# Patient Record
Sex: Female | Born: 1990 | Race: White | Hispanic: No | Marital: Single | Smoking: Never smoker
Health system: Southern US, Community
[De-identification: ages and names within clinical notes are randomized; demographics above are authoritative.]

## PROBLEM LIST (undated history)

## (undated) ENCOUNTER — Inpatient Hospital Stay: Payer: Self-pay

## (undated) DIAGNOSIS — O24419 Gestational diabetes mellitus in pregnancy, unspecified control: Secondary | ICD-10-CM

## (undated) DIAGNOSIS — I498 Other specified cardiac arrhythmias: Secondary | ICD-10-CM

## (undated) HISTORY — PX: TUMOR REMOVAL: SHX12

## (undated) HISTORY — DX: Gestational diabetes mellitus in pregnancy, unspecified control: O24.419

## (undated) HISTORY — PX: TONSILLECTOMY: SUR1361

---

## 2016-05-27 ENCOUNTER — Encounter: Payer: Self-pay | Admitting: Urgent Care

## 2016-05-27 DIAGNOSIS — O2342 Unspecified infection of urinary tract in pregnancy, second trimester: Secondary | ICD-10-CM | POA: Insufficient documentation

## 2016-05-27 DIAGNOSIS — R102 Pelvic and perineal pain: Secondary | ICD-10-CM | POA: Insufficient documentation

## 2016-05-27 DIAGNOSIS — Z3A15 15 weeks gestation of pregnancy: Secondary | ICD-10-CM | POA: Insufficient documentation

## 2016-05-27 DIAGNOSIS — G43909 Migraine, unspecified, not intractable, without status migrainosus: Secondary | ICD-10-CM | POA: Insufficient documentation

## 2016-05-27 DIAGNOSIS — O99352 Diseases of the nervous system complicating pregnancy, second trimester: Secondary | ICD-10-CM | POA: Diagnosis present

## 2016-05-27 LAB — BASIC METABOLIC PANEL
Anion gap: 8 (ref 5–15)
BUN: 8 mg/dL (ref 6–20)
CO2: 21 mmol/L — ABNORMAL LOW (ref 22–32)
Calcium: 9.2 mg/dL (ref 8.9–10.3)
Chloride: 107 mmol/L (ref 101–111)
Creatinine, Ser: 0.59 mg/dL (ref 0.44–1.00)
GFR calc Af Amer: >60 mL/min (ref >60)
GFR calc non Af Amer: >60 mL/min (ref >60)
Glucose, Bld: 214 mg/dL — ABNORMAL HIGH (ref 65–99)
Potassium: 3.2 mmol/L — ABNORMAL LOW (ref 3.5–5.1)
Sodium: 136 mmol/L (ref 135–145)

## 2016-05-27 LAB — CBC
HCT: 32.8 % — ABNORMAL LOW (ref 35.0–47.0)
Hemoglobin: 11.9 g/dL — ABNORMAL LOW (ref 12.0–16.0)
MCH: 29.9 pg (ref 26.0–34.0)
MCHC: 36.2 g/dL — ABNORMAL HIGH (ref 32.0–36.0)
MCV: 82.6 fL (ref 80.0–100.0)
Platelets: 237 10*3/uL (ref 150–440)
RBC: 3.97 MIL/uL (ref 3.80–5.20)
RDW: 13.2 % (ref 11.5–14.5)
WBC: 12.1 10*3/uL — ABNORMAL HIGH (ref 3.6–11.0)

## 2016-05-27 LAB — URINALYSIS COMPLETE WITH MICROSCOPIC (ARMC ONLY)
Bilirubin Urine: NEGATIVE
Glucose, UA: >500 mg/dL — AB
Hgb urine dipstick: NEGATIVE
Nitrite: NEGATIVE
Protein, ur: 30 mg/dL — AB
Specific Gravity, Urine: 1.026 (ref 1.005–1.030)
pH: 5 (ref 5.0–8.0)

## 2016-05-27 LAB — HCG, QUANTITATIVE, PREGNANCY: hCG, Beta Chain, Quant, S: 32650 m[IU]/mL — ABNORMAL HIGH (ref <5)

## 2016-05-27 NOTE — ED Notes (Signed)
Patient presents with c/o LOWER abdominal pain since yesterday. (+) RIGHT flank pain as well. Denies urinary symptoms. Patient also with a migraine h/a; similar to previous. Denies N/V. Of note, patient is an approximately 13 week G2P1A1s followed by [facility]; has first official appointment on Tuesday.

## 2016-05-28 ENCOUNTER — Emergency Department
Admission: EM | Admit: 2016-05-28 | Discharge: 2016-05-28 | Disposition: A | Discharge status: Home / Self Care | Payer: BC Managed Care – PPO | Attending: Emergency Medicine | Admitting: Emergency Medicine

## 2016-05-28 ENCOUNTER — Emergency Department: Payer: BC Managed Care – PPO

## 2016-05-28 DIAGNOSIS — O2342 Unspecified infection of urinary tract in pregnancy, second trimester: Secondary | ICD-10-CM | POA: Diagnosis not present

## 2016-05-28 DIAGNOSIS — O26899 Other specified pregnancy related conditions, unspecified trimester: Secondary | ICD-10-CM

## 2016-05-28 DIAGNOSIS — R109 Unspecified abdominal pain: Secondary | ICD-10-CM

## 2016-05-28 DIAGNOSIS — R1030 Lower abdominal pain, unspecified: Secondary | ICD-10-CM

## 2016-05-28 DIAGNOSIS — N390 Urinary tract infection, site not specified: Secondary | ICD-10-CM

## 2016-05-28 HISTORY — DX: Other specified cardiac arrhythmias: I49.8

## 2016-05-28 MED ORDER — ACETAMINOPHEN 500 MG PO TABS
ORAL_TABLET | ORAL | Status: AC
  Administered 2016-05-28: 03:00:00
  Filled 2016-05-28: qty 2

## 2016-05-28 NOTE — ED Provider Notes (Addendum)
-----------------------------------------   8:09 AM on 05/28/2016 -----------------------------------------  Patient signed out to me is medically cleared pending ultrasound. Ultrasound is underway. There was a delay because of computer downtime apparently. CPK per charting for further details. Patient does have white cells in her urine. Will be treated.   Jeanmarie Plant, MD 05/28/16 0810 .----------------------------------------- 9:32 AM on 05/28/2016 -----------------------------------------  Ultrasound reassuring, and sound asleep with no abdominal discomfort at this time abdomen is benign. Return precautions and follow-up given and understood.   Jeanmarie Plant, MD 05/28/16 [phone]

## 2016-05-28 NOTE — ED Notes (Signed)
Epic downtime occurred, for patient care betwe 05/28/16 from 12:25am to 4:53am see paper chart

## 2016-05-28 NOTE — Discharge Instructions (Signed)
Please follow-up your OB/GYN this week as scheduled. Return to the emergency department for any worsening abdominal pain, vaginal bleeding or discharge.

## 2016-05-30 LAB — OB RESULTS CONSOLE HEPATITIS B SURFACE ANTIGEN: Hepatitis B Surface Ag: NEGATIVE

## 2016-05-30 LAB — OB RESULTS CONSOLE HIV ANTIBODY (ROUTINE TESTING): HIV: NONREACTIVE

## 2016-05-30 LAB — OB RESULTS CONSOLE RPR: RPR: NONREACTIVE

## 2016-05-30 LAB — OB RESULTS CONSOLE RUBELLA ANTIBODY, IGM: RUBELLA: NON-IMMUNE/NOT IMMUNE

## 2016-05-30 LAB — OB RESULTS CONSOLE VARICELLA ZOSTER ANTIBODY, IGG: Varicella: IMMUNE

## 2016-08-30 ENCOUNTER — Telehealth: Payer: Self-pay | Admitting: *Deleted

## 2016-08-30 ENCOUNTER — Encounter: Payer: BC Managed Care – PPO | Attending: Obstetrics and Gynecology | Admitting: *Deleted

## 2016-08-30 ENCOUNTER — Encounter: Payer: Self-pay | Admitting: *Deleted

## 2016-08-30 VITALS — BP 110/62 | Ht 62.0 in | Wt 222.4 lb

## 2016-08-30 DIAGNOSIS — O24419 Gestational diabetes mellitus in pregnancy, unspecified control: Secondary | ICD-10-CM | POA: Insufficient documentation

## 2016-08-30 DIAGNOSIS — O24410 Gestational diabetes mellitus in pregnancy, diet controlled: Secondary | ICD-10-CM

## 2016-08-30 DIAGNOSIS — Z3A00 Weeks of gestation of pregnancy not specified: Secondary | ICD-10-CM | POA: Diagnosis not present

## 2016-08-30 NOTE — Progress Notes (Signed)
Diabetes Self-Management Education  Visit Type: First/Initial  Appt. Start Time: 1045 Appt. End Time: 1220  08/30/2016  Ms. Eileen FantasiaRachel Colligan, identified by name and date of birth, is a 25 y.o. female with a diagnosis of Diabetes: Gestational Diabetes.   ASSESSMENT  Blood pressure 110/62, height 5\' 2"  (1.575 m), weight 222 lb 6.4 oz (100.9 kg), last menstrual period 02/13/2016. Body mass index is 40.68 kg/m.      Diabetes Self-Management Education - 08/30/16 1347      Visit Information   Visit Type First/Initial     Initial Visit   Diabetes Type Gestational Diabetes   Are you currently following a meal plan? Yes   What type of meal plan do you follow? "trying not to eat a lot of sugars"   Are you taking your medications as prescribed? Yes   Date Diagnosed August 01, 2016     Health Coping   How would you rate your overall health? Excellent     Psychosocial Assessment   Patient Belief/Attitude about Diabetes Other (comment)  feels "normal"   Self-care barriers None   Self-management support Doctor's office;Friends;Family   Other persons present Spouse/SO   Patient Concerns Nutrition/Meal planning;Glycemic Control;Monitoring;Healthy Lifestyle   Special Needs None   Preferred Learning Style Auditory;Visual   Learning Readiness Change in progress   How often do you need to have someone help you when you read instructions, pamphlets, or other written materials from your doctor or pharmacy? 1 - Never   What is the last grade level you completed in school? high school diploma     Pre-Education Assessment   Patient understands the diabetes disease and treatment process. Needs Instruction   Patient understands incorporating nutritional management into lifestyle. Needs Instruction   Patient undertands incorporating physical activity into lifestyle. Needs Instruction   Patient understands using medications safely. Needs Instruction   Patient understands monitoring blood glucose,  interpreting and using results Needs Instruction   Patient understands prevention, detection, and treatment of acute complications. Needs Instruction   Patient understands prevention, detection, and treatment of chronic complications. Needs Instruction   Patient understands how to develop strategies to address psychosocial issues. Needs Instruction   Patient understands how to develop strategies to promote health/change behavior. Needs Instruction     Complications   How often do you check your blood sugar? 1-2 times/day  Pt received meter and just started testing.    Fasting Blood glucose range (mg/dL) 69-629;[id number];130-179  FBG's 115 -131 mg/dL   Postprandial Blood glucose range (mg/dL) 244-010;>272130-179;>200  2 post meal readings of 147 and 208 mg/dL.    Have you had a dilated eye exam in the past 12 months? No   Have you had a dental exam in the past 12 months? Yes   Are you checking your feet? No     Dietary Intake   Breakfast cereal, toast, eggs   Lunch sandwich or left overs from supper   Dinner meat (beef, chicken, pork, [place]key) with carrots, zucchini, eggplant, green beans, broccolli, bread, potatoes, corn   Beverage(s) water, sugar sweetened tea     Exercise   Exercise Type Light (walking / raking leaves)  Pt is walking on her job (at least 10, 000 steps)     Patient Education   Previous Diabetes Education No   Disease state  Definition of diabetes, type 1 and 2, and the diagnosis of diabetes   Nutrition management  Role of diet in the treatment of diabetes and the relationship between  the three main macronutrients and blood glucose level   Physical activity and exercise  Role of exercise on diabetes management, blood pressure control and cardiac health.   Monitoring Purpose and frequency of SMBG.;Taught/discussed recording of test results and interpretation of SMBG.;Ketone testing, when, how.   Chronic complications Relationship between chronic complications and blood glucose control    Psychosocial adjustment Identified and addressed patients feelings and concerns about diabetes   Preconception care Pregnancy and GDM  Role of pre-pregnancy blood glucose control on the development of the fetus;Reviewed with patient blood glucose goals with pregnancy;Role of family planning for patients with diabetes     Outcomes   Expected Outcomes Demonstrated interest in learning. Expect positive outcomes   Future DMSE 2 wks      Individualized Plan for Diabetes Self-Management Training:   Learning Objective:  Patient will have a greater understanding of diabetes self-management. Patient education plan is to attend individual and/or group sessions per assessed needs and concerns.   Plan:   Patient Instructions  Read booklet on Gestational Diabetes Follow Gestational Meal Planning Guidelines Complete a 3 Day Food Record and bring to next appointment Avoid fruit juice and sugar sweetened drinks Check blood sugars 4 x day - before breakfast and 2 hrs after every meal and record  Bring blood sugar log to all appointments Purchase urine ketone strips if blood sugars not controlled and check urine ketones every am:  If + increase bedtime snack to 1 protein and 2 carbohydrate servings Walk 20-30 minutes at least 5 x week if permitted by MD   Expected Outcomes:  Demonstrated interest in learning. Expect positive outcomes  Education material provided:  Gestational Booklet Gestational Meal Planning Guidelines Viewed Gestational Diabetes Video 3 Day Food Record Goals for a Healthy Pregnancy  If problems or questions, patient to contact team via:  Sharion Settler, RN, CCM, CDE [phone]  Future DSME appointment: 2 wks  Thursday September 14, 2016 with [facility] - dietitian

## 2016-08-30 NOTE — Patient Instructions (Signed)
Read booklet on Gestational Diabetes Follow Gestational Meal Planning Guidelines Complete a 3 Day Food Record and bring to next appointment Avoid fruit juice and sugar sweetened drinks Check blood sugars 4 x day - before breakfast and 2 hrs after every meal and record  Bring blood sugar log to all appointments Purchase urine ketone strips if blood sugars not controlled and check urine ketones every am:  If + increase bedtime snack to 1 protein and 2 carbohydrate servings Walk 20-30 minutes at least 5 x week if permitted by MD

## 2016-08-30 NOTE — Telephone Encounter (Signed)
Left message with Angie regarding pt's elevated blood sugars. She is not scheduled for follow up visit in their office until Sep 12, 2016. Pt will probably require diabetes medication during pregnancy.

## 2016-09-04 ENCOUNTER — Encounter: Payer: Self-pay | Admitting: *Deleted

## 2016-09-04 ENCOUNTER — Ambulatory Visit
Admission: RE | Admit: 2016-09-04 | Discharge: 2016-09-04 | Disposition: A | Discharge status: Home / Self Care | Payer: BC Managed Care – PPO | Source: Ambulatory Visit | Attending: Obstetrics & Gynecology | Admitting: Obstetrics & Gynecology

## 2016-09-04 VITALS — BP 128/77 | HR 98 | Temp 98.2°F | Resp 17 | Ht 62.0 in | Wt 220.0 lb

## 2016-09-04 DIAGNOSIS — Z3A29 29 weeks gestation of pregnancy: Secondary | ICD-10-CM | POA: Diagnosis not present

## 2016-09-04 DIAGNOSIS — O24414 Gestational diabetes mellitus in pregnancy, insulin controlled: Secondary | ICD-10-CM | POA: Diagnosis present

## 2016-09-04 DIAGNOSIS — Z3A34 34 weeks gestation of pregnancy: Secondary | ICD-10-CM | POA: Insufficient documentation

## 2016-09-04 DIAGNOSIS — Z794 Long term (current) use of insulin: Secondary | ICD-10-CM | POA: Diagnosis not present

## 2016-09-04 DIAGNOSIS — Z6838 Body mass index (BMI) 38.0-38.9, adult: Secondary | ICD-10-CM

## 2016-09-04 DIAGNOSIS — E6609 Other obesity due to excess calories: Secondary | ICD-10-CM | POA: Diagnosis not present

## 2016-09-04 LAB — KETONES, URINE: Ketones, ur: NEGATIVE mg/dL

## 2016-09-04 LAB — GLUCOSE, CAPILLARY: Glucose-Capillary: 129 mg/dL — ABNORMAL HIGH (ref 65–99)

## 2016-09-04 MED ORDER — "INSULIN SYRINGE-NEEDLE U-100 31G X 15/64"" 0.3 ML MISC": 5.0000 [IU] | Freq: Three times a day (TID) | 11 refills | Status: DC

## 2016-09-04 MED ORDER — INSULIN LISPRO 100 UNIT/ML ~~LOC~~ SOLN: 5.0000 [IU] | Freq: Three times a day (TID) | SUBCUTANEOUS | 11 refills | Status: DC

## 2016-09-04 MED ORDER — INSULIN GLARGINE 100 UNIT/ML SOLOSTAR PEN: 15.0000 [IU] | PEN_INJECTOR | Freq: Every day | SUBCUTANEOUS | 11 refills | Status: DC

## 2016-09-04 MED ORDER — INSULIN PEN NEEDLE 32G X 4 MM MISC: 1.0000 "pen " | Freq: Every day | 11 refills | Status: DC

## 2016-09-04 NOTE — Progress Notes (Signed)
[facility] Consultation   Chief Complaint: gestational diabetes, insulin management  HPI: Ms. Tillie FantasiaRachel Schrom is a 25 y.o. G2P0 at 29w1 [facility](EDC 11/20/15) who presents in consultation from Dr. Elesa MassedWard for gestational diabetes management.   Ms. Adriana SimasCook has no ob concerns today.  Report the GDM is the first "issue she's had in pregnancy."  Past Medical History: Patient  has a past medical history of Gestational diabetes and Sinus arrhythmia.  Past Surgical History: She  has a past surgical history that includes Tonsillectomy and Tumor removal (Left).  Obstetric History:  OB History    Gravida Para Term Preterm AB Living   2         0   SAB TAB Ectopic Multiple Live Births                G1 - SAB at 10 weeks.  Medically managed.    Gynecologic History:  Patient's last menstrual period was 02/13/2016.   Medications: Potassium, was given script for recently dx UTI (has not picked it up yet) Allergies: Patient has No Known Allergies.  Social History: Patient  reports that she has never smoked. She has never used smokeless tobacco. She reports that she does not drink alcohol.  Family History: family history includes Diabetes in her father, maternal grandfather, maternal grandmother, paternal grandfather, and paternal grandmother.  Review of Systems A full 12 point review of systems was negative or as noted in the History of Present Illness. No bleeding, cramping, spotting.  No N/V. Physical Exam: BP 128/77   Pulse 98   Temp 98.2 F (36.8 C)   Resp 17   Ht 5\' 2"  (1.575 m)   Wt 220 lb (99.8 kg)   LMP 02/13/2016   SpO2 97%   BMI 40.24 kg/m   FHR140's   Asessement: 25 yo G2 P0010 @ 29 1/7 presenting with GDM and poorly controlled blood sugars with dietary modifications.    Plan: - Gestational diabetes:   Ms. Adriana SimasCook was diagnosed with GDM based on a 1 hr GCT that was 208 on 08/29/16.  Given value was over 200 she was appropriately given the diagnosis of GDM.   Ms. Patsey BertholdCook's BMI is  4638 and I do not see a record of an early glucose test.  Ms. Adriana SimasCook does see a PCP yearly.  She is unsure if she has been screened for diabetes outside of pregnancy.   Ms. Adriana SimasCook was seen by [facility] and was counseled about a diabetic diet.  She reports adherence with this diet.  Ms.  Adriana SimasCook was also counseled about glucose monitoring and presents with a log of her blood sugars.  Despite dietary modifications her log reveals poor control.  Fasting:  100 - 159 (5/5 values out of range) 2 hours post breakfast: 95-166 (3/4 values out of range)  4 hours post lunch: 80-174 (2/4 out of range) 2 hours post dinner:  143, 150 (2/2 out of range) Bedtime:  117-183   We discussed our goals for fasting sugars below 95 and 3 hour post meals less than 120.  She has some difficulty check sugars due to her work schedule.  Her shifts start at 11 am and she doesn't get off work until midnight.    Given her poorly controlled sugars I agree with the plan for initiating insulin therapy.  Ms. Adriana SimasCook is insulin naive therefore I started with conservative dosing - Lantus 10 qhs and Humalog 5 TID with meals  For a total of 25 units of  insulin.  Her weight based dosing would be much higher and I did warn Ms Adriana SimasCook she is likely to need an increase.  We discussed the natural history of diabetes in pregnancy with increasing insulin requirements.  We also discussed the risks of poorly controlled diabetes in pregnancy including excess fetal growth, polyhydramnios and stillbirth.  She expressed her understanding.  We planned for a follow up consult in 1 week to determine her glycemic control and titrate the insulin as needed.   -Fetal Well-being: Ms. Adriana SimasCook reports she is scheduled for a growth ultrasound on 09/12/16.  We are happy to provide growth scan in 1 week with her follow up consult and insulin management.  We discussed recommendation for growth scans q 4 weeks given her GDM A2 dx.  We also discussed fetal monitoring with weekly  NST/AFI starting at 32-34 weeks increasing to twice weekly NST and weekly AFI at 34-36 weeks.  We are happy to preform fetal monitoring as desired by the primary group.    Delivery timing will be determined based on fetal growth and glycemic control.    - Maternal Well-being:   We discussed that both her elevated BMI and GDM diagnosis increases the risk for T2 DM outside of pregnancy. We reviewed the IOM weight gain guidelines.  We also discussed plans for continued follow up with her PCP for DM screening and weight loss post partum.  Ms. Adriana SimasCook plans to breast feed which will help with her post partum weight loss.   Total time spent with the patient was 30 minutes with greater than 50% spent in counseling and coordination of care. We appreciate this interesting consult and will be happy to be involved in the ongoing care of Ms. Adriana SimasCook in anyway her obstetricians desire.  Oran ReinSarahn Donie Moulton, MD Maternal-Fetal Medicine [facility]

## 2016-09-11 ENCOUNTER — Ambulatory Visit
Admission: RE | Admit: 2016-09-11 | Discharge: 2016-09-11 | Disposition: A | Discharge status: Home / Self Care | Payer: BC Managed Care – PPO | Source: Ambulatory Visit | Attending: Maternal & Fetal Medicine | Admitting: Maternal & Fetal Medicine

## 2016-09-11 VITALS — BP 102/67 | HR 102 | Temp 98.3°F | Wt 220.0 lb

## 2016-09-11 DIAGNOSIS — O24419 Gestational diabetes mellitus in pregnancy, unspecified control: Secondary | ICD-10-CM

## 2016-09-11 DIAGNOSIS — Z3A30 30 weeks gestation of pregnancy: Secondary | ICD-10-CM | POA: Diagnosis not present

## 2016-09-11 DIAGNOSIS — O24414 Gestational diabetes mellitus in pregnancy, insulin controlled: Secondary | ICD-10-CM

## 2016-09-11 DIAGNOSIS — Z833 Family history of diabetes mellitus: Secondary | ICD-10-CM | POA: Insufficient documentation

## 2016-09-11 DIAGNOSIS — Z794 Long term (current) use of insulin: Secondary | ICD-10-CM | POA: Insufficient documentation

## 2016-09-11 LAB — GLUCOSE, CAPILLARY: Glucose-Capillary: 116 mg/dL — ABNORMAL HIGH (ref 65–99)

## 2016-09-11 NOTE — Progress Notes (Signed)
[facility] Follow up Consultation  Eileen Hunt is a 25 y.o. G2P0 at 30w1 [facility](EDC 11/20/15) who presents in consultation from Dr. Elesa MassedWard for a follow up consultation due to  gestational diabetes. She started on insulin at the time of her last consultation and has seen the [facility].  She had questions about glycemic targets and wanted a list of optimal foods to eat.  Her mother in law cooks dinners for the family.  Mother in law is diabetic and waiting for kidney transplant.  She works second shift (from 4p to 12a) and wakes around 9-10a daily.   Blood glucose values: 10/26-11/6 Fastings are all out of range (with exception of this am) 159, 124, 100, 127, 125(MFM consult), 118, 119, 111, 106, 110, 107, 81 Post breakfast 166, 83, 80 Post lunch 80, 163, 88, 174, 162(MFM consult), 97, 105, 93, 176, 142 Post dinner 150, 143, 169(MFM consult), 202(apple cobbler on halloween), 200, 132, 111, 90, 90  Random blood glucose today: 116 (ate breakfast)   Past Medical History: Sinus arrhythmia.  Past Surgical History: She  has a past surgical history that includes Tonsillectomy and Tumor removal (Left).  Obstetric History:          OB History    Gravida Para Term Preterm AB Living   2         0   SAB TAB Ectopic Multiple Live Births                G1 - SAB at 10 weeks.  Medically managed.    Gynecologic History:  Patient's last menstrual period was 02/13/2016.   Current Outpatient Prescriptions on File Prior to Encounter  Medication Sig Dispense Refill  . Insulin Glargine (LANTUS) 100 UNIT/ML Solostar Pen Inject 15 Units into the skin daily at 10 pm. 15 mL 11  . insulin lispro (HUMALOG) 100 UNIT/ML injection Inject 0.05 mLs (5 Units total) into the skin 3 (three) times daily before meals. 10 mL 11  . Insulin Pen Needle (RELION PEN NEEDLES) 32G X 4 MM MISC 1 pen by Does not apply route daily at 10 pm. 50 each 11  . Insulin Syringe-Needle U-100 (RELION INSULIN  SYRINGE) 31G X 15/64" 0.3 ML MISC 5 Units by Does not apply route 3 (three) times daily. 100 each 11  . potassium chloride SA (K-DUR,KLOR-CON) 20 MEQ tablet Take 20 mEq by mouth daily.    . Prenatal Vit-Fe Fumarate-FA (PRENATAL VITAMIN PO) Take 1 tablet by mouth daily.     No current facility-administered medications on file prior to encounter.     Allergies: Patient has No Known Allergies.  Social History: Patient  reports that she has never smoked. She has never used smokeless tobacco. She reports that she does not drink alcohol.  Family History: family history includes Diabetes in her father, maternal grandfather, maternal grandmother, paternal grandfather, and paternal grandmother.   [id number]  Vitals:   09/11/16 1044  BP: 102/67  Pulse: (!) 102  Temp: 98.3 F (36.8 C)    Asessement: 25 yo G2 P0010 at 3430 1/7 with GDM--fasting still poorly controlled but improving.  Postprandials improving slightly (over last week) --increased bedtime lantus to 20u, will keep mealtime novalog 5/5/5 --we will email her [facility]'s dietary list.  We tried to place her back in contact with [facility] but were unable to reach them during the visit.  --also reviewed glycemic targets of fastings under 95 and 2h post prandial less than 130 --she will  rtc in 1 week to review blood glucose values (we can also review ultrasound she is scheduled to have tomorrow)

## 2016-09-14 ENCOUNTER — Encounter: Payer: Self-pay | Admitting: Dietician

## 2016-09-14 ENCOUNTER — Encounter: Payer: BC Managed Care – PPO | Attending: Obstetrics and Gynecology | Admitting: Dietician

## 2016-09-14 VITALS — BP 110/58 | Ht 62.0 in | Wt 220.5 lb

## 2016-09-14 DIAGNOSIS — O24414 Gestational diabetes mellitus in pregnancy, insulin controlled: Secondary | ICD-10-CM

## 2016-09-14 DIAGNOSIS — O99810 Abnormal glucose complicating pregnancy: Secondary | ICD-10-CM | POA: Diagnosis not present

## 2016-09-14 DIAGNOSIS — Z3A00 Weeks of gestation of pregnancy not specified: Secondary | ICD-10-CM | POA: Insufficient documentation

## 2016-09-14 NOTE — Progress Notes (Signed)
   Patient's BG record indicates BGs improving since beginning insulin, still having some elevated readings. FBGs ranging 81-120 in the past week; post-meal BGs ranging 75-189 in past week.   Patient's food diary indicates generally balanced meals, including protein sources regularly. Some food portions possibly too large. Instructed patient on appropriate carbohydrate portions.    Provided 1800kcal meal plan, and wrote individualized menus based on patient's food preferences.  Instructed patient on food safety, including avoidance of Listeriosis, and limiting mercury from fish.  Discussed importance of maintaining healthy lifestyle habits to reduce risk of Type 2 DM as well as Gestational DM with any future pregnancies.  Advised patient to use any remaining testing supplies to test some BGs after delivery, and to have BG tested ideally annually, as well as prior to attempting future pregnancies.

## 2016-09-14 NOTE — Patient Instructions (Signed)
   Follow meal plan as prescribed.   If blood sugar drops too low, first drink 4oz of juice or soda, wait 10-15 minutes, then if feeling better/ BG improved, eat a snack with protein or a meal.

## 2016-09-18 ENCOUNTER — Ambulatory Visit: Payer: BC Managed Care – PPO

## 2016-09-30 ENCOUNTER — Observation Stay
Admission: RE | Admit: 2016-09-30 | Discharge: 2016-09-30 | Disposition: A | Discharge status: Home / Self Care | Payer: BC Managed Care – PPO | Attending: Obstetrics and Gynecology | Admitting: Obstetrics and Gynecology

## 2016-09-30 DIAGNOSIS — O24414 Gestational diabetes mellitus in pregnancy, insulin controlled: Principal | ICD-10-CM | POA: Insufficient documentation

## 2016-09-30 DIAGNOSIS — O24419 Gestational diabetes mellitus in pregnancy, unspecified control: Secondary | ICD-10-CM

## 2016-09-30 DIAGNOSIS — Z3A32 32 weeks gestation of pregnancy: Secondary | ICD-10-CM | POA: Diagnosis not present

## 2016-09-30 NOTE — Progress Notes (Signed)
Patient ID: Eileen Hunt, female   DOB: September 15, 1991, 25 y.o.   MRN: [id number] Eileen Hunt September 15, 1991 G2 P0 [id number] presents for NStT given ID GDM .  noLOF , no vaginal bleeding , Meds : Lantus and humalog  O;BP 126/76 (BP Location: Left Arm)   Pulse 98   Temp 98.4 F (36.9 C) (Oral)   Resp 18   LMP 02/13/2016  ABD gravid  CX not checked  NST Reactive , no decels  Labs: none  A: ID GDM  Reassuring fetal monitoring  P:d/c home  [facility]

## 2016-09-30 NOTE — OB Triage Note (Signed)
Eileen Hunt presents today for a scheduled NST. Pt placed on monitor at 0830.

## 2016-09-30 NOTE — Discharge Summary (Signed)
  Suzy Bouchardhomas J Schermerhorn, MD  Obstetrics    [] Hide copied text [] Hover for attribution information Patient ID: Eileen FantasiaRachel Demello, female   DOB: [birth year], 25 y.o.   MRN: [id number] Eileen FantasiaRachel Bogusz [birth year] G2 P0 [id number] presents for NStT given ID GDM .  noLOF , no vaginal bleeding , Meds : Lantus and humalog  O;BP 126/76 (BP Location: Left Arm)   Pulse 98   Temp 98.4 F (36.9 C) (Oral)   Resp 18   LMP 02/13/2016  ABD gravid  CX not checked  NST Reactive , no decels  Labs: none  A: ID GDM  Reassuring fetal monitoring  [facility]c home  [facility]     Electronically signed by Suzy Bouchardhomas J Schermerhorn, MD at 09/30/2016 9:24 AM

## 2016-10-24 LAB — OB RESULTS CONSOLE GC/CHLAMYDIA
CHLAMYDIA, DNA PROBE: NEGATIVE
GC PROBE AMP, GENITAL: NEGATIVE

## 2016-11-05 ENCOUNTER — Encounter: Payer: Self-pay | Admitting: *Deleted

## 2016-11-05 ENCOUNTER — Inpatient Hospital Stay
Admission: EM | Admit: 2016-11-05 | Discharge: 2016-11-07 | DRG: 774 | Disposition: A | Discharge status: Home / Self Care | Payer: BC Managed Care – PPO | Attending: Obstetrics and Gynecology | Admitting: Obstetrics and Gynecology

## 2016-11-05 DIAGNOSIS — Z6838 Body mass index (BMI) 38.0-38.9, adult: Secondary | ICD-10-CM

## 2016-11-05 DIAGNOSIS — O24424 Gestational diabetes mellitus in childbirth, insulin controlled: Secondary | ICD-10-CM | POA: Diagnosis present

## 2016-11-05 DIAGNOSIS — Z833 Family history of diabetes mellitus: Secondary | ICD-10-CM | POA: Diagnosis not present

## 2016-11-05 DIAGNOSIS — O99214 Obesity complicating childbirth: Secondary | ICD-10-CM | POA: Diagnosis present

## 2016-11-05 DIAGNOSIS — Z23 Encounter for immunization: Secondary | ICD-10-CM

## 2016-11-05 DIAGNOSIS — O99210 Obesity complicating pregnancy, unspecified trimester: Secondary | ICD-10-CM | POA: Diagnosis present

## 2016-11-05 DIAGNOSIS — E669 Obesity, unspecified: Secondary | ICD-10-CM | POA: Diagnosis present

## 2016-11-05 DIAGNOSIS — O1404 Mild to moderate pre-eclampsia, complicating childbirth: Secondary | ICD-10-CM | POA: Diagnosis present

## 2016-11-05 DIAGNOSIS — Z3A38 38 weeks gestation of pregnancy: Secondary | ICD-10-CM | POA: Diagnosis not present

## 2016-11-05 DIAGNOSIS — O1403 Mild to moderate pre-eclampsia, third trimester: Secondary | ICD-10-CM | POA: Diagnosis present

## 2016-11-05 DIAGNOSIS — Z3493 Encounter for supervision of normal pregnancy, unspecified, third trimester: Secondary | ICD-10-CM | POA: Diagnosis present

## 2016-11-05 DIAGNOSIS — Z283 Underimmunization status: Secondary | ICD-10-CM

## 2016-11-05 DIAGNOSIS — O9989 Other specified diseases and conditions complicating pregnancy, childbirth and the puerperium: Secondary | ICD-10-CM

## 2016-11-05 DIAGNOSIS — O09899 Supervision of other high risk pregnancies, unspecified trimester: Secondary | ICD-10-CM

## 2016-11-05 DIAGNOSIS — Z2839 Other underimmunization status: Secondary | ICD-10-CM

## 2016-11-05 DIAGNOSIS — O24414 Gestational diabetes mellitus in pregnancy, insulin controlled: Secondary | ICD-10-CM | POA: Diagnosis present

## 2016-11-05 LAB — CBC
HEMATOCRIT: 36.8 % (ref 35.0–47.0)
HEMOGLOBIN: 12.5 g/dL (ref 12.0–16.0)
MCH: 28.6 pg (ref 26.0–34.0)
MCHC: 34.1 g/dL (ref 32.0–36.0)
MCV: 83.9 fL (ref 80.0–100.0)
Platelets: 201 10*3/uL (ref 150–440)
RBC: 4.38 MIL/uL (ref 3.80–5.20)
RDW: 14.7 % — ABNORMAL HIGH (ref 11.5–14.5)
WBC: 16.2 10*3/uL — ABNORMAL HIGH (ref 3.6–11.0)

## 2016-11-05 LAB — COMPREHENSIVE METABOLIC PANEL
ALBUMIN: 2.8 g/dL — AB (ref 3.5–5.0)
ALK PHOS: 148 U/L — AB (ref 38–126)
ALT: 21 U/L (ref 14–54)
ANION GAP: 9 (ref 5–15)
AST: 18 U/L (ref 15–41)
BUN: 10 mg/dL (ref 6–20)
CALCIUM: 9.1 mg/dL (ref 8.9–10.3)
CO2: 20 mmol/L — AB (ref 22–32)
Chloride: 108 mmol/L (ref 101–111)
Creatinine, Ser: 0.58 mg/dL (ref 0.44–1.00)
GFR calc Af Amer: >60 mL/min (ref >60)
GFR calc non Af Amer: >60 mL/min (ref >60)
GLUCOSE: 70 mg/dL (ref 65–99)
Potassium: 3.6 mmol/L (ref 3.5–5.1)
SODIUM: 137 mmol/L (ref 135–145)
Total Bilirubin: 0.3 mg/dL (ref 0.3–1.2)
Total Protein: 6.8 g/dL (ref 6.5–8.1)

## 2016-11-05 LAB — GLUCOSE, CAPILLARY
Glucose-Capillary: 89 mg/dL (ref 65–99)
Glucose-Capillary: 69 mg/dL (ref 65–99)

## 2016-11-05 LAB — TYPE AND SCREEN
ABO/RH(D): O POS
ANTIBODY SCREEN: NEGATIVE

## 2016-11-05 LAB — PROTEIN / CREATININE RATIO, URINE
Creatinine, Urine: 55 mg/dL
Creatinine, Urine: 89 mg/dL
PROTEIN CREATININE RATIO: 0.38 mg/mg{creat} — AB (ref 0.00–0.15)
Protein Creatinine Ratio: 0.25 mg/mg{Cre} — ABNORMAL HIGH (ref 0.00–0.15)
TOTAL PROTEIN, URINE: 22 mg/dL
Total Protein, Urine: 21 mg/dL

## 2016-11-05 LAB — CHLAMYDIA/NGC RT PCR (ARMC ONLY)
Chlamydia Tr: NOT DETECTED
N gonorrhoeae: NOT DETECTED

## 2016-11-05 LAB — URIC ACID: Uric Acid, Serum: 5.9 mg/dL (ref 2.3–6.6)

## 2016-11-05 MED ORDER — OXYTOCIN 40 UNITS IN LACTATED RINGERS INFUSION - SIMPLE MED: 2.5000 [IU]/h | INTRAVENOUS | Status: DC

## 2016-11-05 MED ORDER — OXYTOCIN BOLUS FROM INFUSION: 500.0000 mL | Freq: Once | INTRAVENOUS | Status: DC

## 2016-11-05 MED ORDER — SOD CITRATE-CITRIC ACID 500-334 MG/5ML PO SOLN: 30.0000 mL | ORAL | Status: DC | PRN

## 2016-11-05 MED ORDER — DEXTROSE IN LACTATED RINGERS 5 % IV SOLN: INTRAVENOUS | Status: DC

## 2016-11-05 MED ORDER — LACTATED RINGERS IV SOLN: INTRAVENOUS | Status: DC

## 2016-11-05 MED ORDER — ONDANSETRON HCL 4 MG/2ML IJ SOLN
4.0000 mg | Freq: Four times a day (QID) | INTRAMUSCULAR | Status: DC | PRN
Start: 2016-11-05 — End: 2016-11-06

## 2016-11-05 MED ORDER — ACETAMINOPHEN 325 MG PO TABS: 650.0000 mg | ORAL_TABLET | ORAL | Status: DC | PRN

## 2016-11-05 MED ORDER — BUTORPHANOL TARTRATE 1 MG/ML IJ SOLN
1.0000 mg | INTRAMUSCULAR | Status: DC | PRN
  Administered 2016-11-06: 1 mg via INTRAVENOUS
  Administered 2016-11-06: 0.5 mg via INTRAVENOUS
  Filled 2016-11-05 (×2): qty 1

## 2016-11-05 MED ORDER — LACTATED RINGERS IV SOLN: 500.0000 mL | INTRAVENOUS | Status: DC | PRN

## 2016-11-05 MED ORDER — LIDOCAINE HCL (PF) 1 % IJ SOLN
30.0000 mL | INTRAMUSCULAR | Status: DC | PRN
Start: 2016-11-05 — End: 2016-11-07

## 2016-11-05 NOTE — Progress Notes (Signed)
S:  Still feeling some painful ctxs      Feeling exhausted      Denies HA, visual disturbances, epigastric pain   O:  VS: Blood pressure 128/76, pulse 83, temperature 98.3 F (36.8 C), temperature source Axillary, resp. rate 16, height 5\' 2"  (1.575 m), weight 103 kg (227 lb), last menstrual period 02/13/2016.        FHR : baseline 140 bpm / variability moderate / accelerations + / no decelerations        Toco: contractions every 5 minutes / mild-moderate        Cervix: Dilation: 4.5 Effacement (%): 70 Cervical Position: Middle Station: -1 Presentation: Vertex Exam by:: M. Samarra Ridgely CNM        Membranes: intact Protein/creatinine ratio: 0.38   A: Latent labor     FHR category 1     A2GDM - insulin dependent, CBGs stable       New onset Pre-eclampsia with proteinuria without severe features  P: Advised to decrease stimulation in room      Continuous fetal monitoring     AROM when appropriate      Anticipate NSVD  Dr. Dalbert GarnetBeasley updated on status and lab results   Carlean JewsMeredith Casy Tavano, CNM

## 2016-11-05 NOTE — Progress Notes (Signed)
S:  States ctxs are more intense and is ready for AROM   O:  VS: Blood pressure (!) 147/93, pulse 95, temperature 98.3 F (36.8 C), temperature source Oral, resp. rate 18, height 5\' 2"  (1.575 m), weight 103 kg (227 lb), last menstrual period 02/13/2016.        FHR : baseline 140 bpm  / variability moderate / accelerations + / no decelerations        Toco: contractions every 2-7 minutes / mild-moderate         Cervix : Dilation: 5 Effacement (%): 80 Cervical Position: Middle Station: -1 Presentation: Vertex Exam by:: Joelene MillinM. Sigmon, CNM        Membranes: AROM at 2321 for clear fluid  A: Latent labor     FHR category 1  P: AROM for clear fluid     Pain medication PRN       May have epidural upon request     Anticipate NSVD  Carlean JewsMeredith Sigmon, CNM

## 2016-11-05 NOTE — OB Triage Note (Signed)
Patient admitted to obs 2 for complaint of contractions.  Contractions started last evening around 7pm. She states that contractions increased in intensity around noon after having sexual intercourse. No complaint of bleeding or leaking of fluid.

## 2016-11-05 NOTE — H&P (Signed)
OB ADMISSION/ HISTORY & PHYSICAL:  Admission Date: 11/05/2016  3:44 PM  Admit Diagnosis: Indication for labor and delivery at 38 weeks   Eileen FantasiaRachel Winders is a 25 y.o. female G2P0 at 6738 weeks presenting for labor contractions.  She reports her contractions started around 7pm last night, but have progressive gotten stronger since noon.    Prenatal History: G2P0   EDC : 11/19/2016, by LMP 02/13/16 [facility]  Prenatal course complicated by Insulin Dependent Gestational Diabetes, Obesity, Hx: Patient's maternal side: 1st Cousin with autism, FOB's 2nd cousin has cerebral palsy, Heart palpitations s/p cardiology consult on 07/12/16, Echo on 07/27/16 - mild valvular regurgitation - no f/u indicated, unless worsening s/s, Heart murmur, Rubella Non-Immune   Prenatal Labs: ABO, Rh:  O Positive  Antibody:  Negative Rubella: Nonimmune (07/25 0000)  RPR: Nonreactive (07/25 0000)  HBsAg: Negative (07/25 0000)  HIV: Non-reactive (07/25 0000)  GTT: Early 1 hour GTT 228 Hgb A1C 7 initially on 10/16, Hgb A1C 6.3 on 10/17/16 GBS:   Negative   Declined first and second trimester genetic screening  Flu vaccine: declined Tdap vaccine: given 09/19/16 Medical / Surgical History :  Past medical history:  Past Medical History:  Diagnosis Date  . Gestational diabetes   . Sinus arrhythmia      Past surgical history:  Past Surgical History:  Procedure Laterality Date  . TONSILLECTOMY    . TUMOR REMOVAL Left    left axilla - cat scratch fever as a child    Family History:  Family History  Problem Relation Age of Onset  . Diabetes Father   . Diabetes Maternal Grandmother   . Diabetes Maternal Grandfather   . Diabetes Paternal Grandmother   . Diabetes Paternal Grandfather      Social History:  reports that she has never smoked. She has never used smokeless tobacco. She reports that she does not drink alcohol or use drugs.   Allergies: Patient has no known allergies.     Current Medications at time of admission:  Prior to Admission medications   Medication Sig Start Date End Date Taking? Authorizing Provider  Insulin Glargine (LANTUS) 100 UNIT/ML Solostar Pen Inject 15 Units into the skin daily at 10 pm. Patient taking differently: Inject 36 Units into the skin daily at 10 pm.  09/04/16  Yes Oran ReinSarahn Wheeler, MD  Insulin Pen Needle (RELION PEN NEEDLES) 32G X 4 MM MISC 1 pen by Does not apply route daily at 10 pm. 09/04/16  Yes Oran ReinSarahn Wheeler, MD  Insulin Syringe-Needle U-100 (RELION INSULIN SYRINGE) 31G X 15/64" 0.3 ML MISC 5 Units by Does not apply route 3 (three) times daily. 09/04/16  Yes Oran ReinSarahn Wheeler, MD  phenylephrine (SUDAFED PE) 10 MG TABS tablet Take 10 mg by mouth every 4 (four) hours as needed.   Yes Historical Provider, MD  Prenatal Vit-Fe Fumarate-FA (PRENATAL VITAMIN PO) Take 1 tablet by mouth daily.   Yes Historical Provider, MD  ranitidine (ZANTAC) 150 MG capsule Take 150 mg by mouth 2 (two) times daily.   Yes Historical Provider, MD  insulin lispro (HUMALOG) 100 UNIT/ML injection Inject 0.05 mLs (5 Units total) into the skin 3 (three) times daily before meals. Patient taking differently: Inject 14 Units into the skin 3 (three) times daily before meals.  09/04/16   Oran ReinSarahn Wheeler, MD     Review of Systems: Active FM onset of ctx @ 1200pm currently every 2-4 minutes No LOF  / SROM Bloody show present  Physical Exam:  VS: Blood pressure (!) 141/87, pulse 81, temperature 98.3 F (36.8 C), temperature source Axillary, resp. rate 16, height 5\' 2"  (1.575 m), weight 103 kg (227 lb), last menstrual period 02/13/2016.  General: alert and oriented, appears calm, breathing well through contractions Heart: RRR Lungs: Clear lung fields Abdomen: Gravid, soft and non-tender, non-distended / uterus: gravid, non-tender Extremities: no edema  Genitalia / VE: Dilation: 4 Effacement (%): 70 Station: -2, -1 Exam by::  (m.sigmon cnm)  FHR:  baseline rate 135 bpm / variability moderate / accelerations + / no decelerations TOCO: every 2-4 minutes/ mild-moderate  Initial CBG: 89  Assessment: [redacted] weeks gestation Labor  FHR category 1 A2GDM - insulin dependent  Elevated Blood pressure    Plan:  [facility]      - Routine labor and delivery orders     - Stadol PRN for pain     - May have epidural upon request  2. GBS Negative     - No prophylaxis indicated 3. A2 GDM - insulin dependent      - Check CBGs every 4 hours     - Initiate GlucoseStabilizer if CBG >120 and Call provider  4. Elevated BP vs. gHTN      - Continue to monitor BPs every hour     - Baseline Pre-eclampsia labs  5. Anticipate NSVD  Dr. Dalbert GarnetBeasley notified of admission / plan of care  Carlean JewsMeredith Sigmon, CNM

## 2016-11-06 LAB — CBC
HEMATOCRIT: 31.3 % — AB (ref 35.0–47.0)
HEMOGLOBIN: 10.8 g/dL — AB (ref 12.0–16.0)
MCH: 29.1 pg (ref 26.0–34.0)
MCHC: 34.4 g/dL (ref 32.0–36.0)
MCV: 84.6 fL (ref 80.0–100.0)
Platelets: 194 10*3/uL (ref 150–440)
RBC: 3.71 MIL/uL — AB (ref 3.80–5.20)
RDW: 14.4 % (ref 11.5–14.5)
WBC: 18.1 10*3/uL — AB (ref 3.6–11.0)

## 2016-11-06 LAB — GLUCOSE, CAPILLARY
GLUCOSE-CAPILLARY: 231 mg/dL — AB (ref 65–99)
Glucose-Capillary: 106 mg/dL — ABNORMAL HIGH (ref 65–99)
Glucose-Capillary: 215 mg/dL — ABNORMAL HIGH (ref 65–99)
Glucose-Capillary: 140 mg/dL — ABNORMAL HIGH (ref 65–99)
Glucose-Capillary: 102 mg/dL — ABNORMAL HIGH (ref 65–99)
Glucose-Capillary: 81 mg/dL (ref 65–99)

## 2016-11-06 MED ORDER — WITCH HAZEL-GLYCERIN EX PADS: 1.0000 "application " | MEDICATED_PAD | Freq: Two times a day (BID) | CUTANEOUS | Status: DC

## 2016-11-06 MED ORDER — LIDOCAINE HCL (PF) 1 % IJ SOLN
INTRAMUSCULAR | Status: AC
  Filled 2016-11-06: qty 30

## 2016-11-06 MED ORDER — DIBUCAINE 1 % RE OINT: 1.0000 "application " | TOPICAL_OINTMENT | Freq: Two times a day (BID) | RECTAL | Status: DC

## 2016-11-06 MED ORDER — OXYTOCIN 40 UNITS IN LACTATED RINGERS INFUSION - SIMPLE MED
INTRAVENOUS | Status: AC
Start: 2016-11-06 — End: 2016-11-06
  Filled 2016-11-06: qty 1000

## 2016-11-06 MED ORDER — IBUPROFEN 600 MG PO TABS
600.0000 mg | ORAL_TABLET | Freq: Four times a day (QID) | ORAL | Status: DC
  Administered 2016-11-06 – 2016-11-07 (×6): 600 mg via ORAL
  Filled 2016-11-06 (×6): qty 1

## 2016-11-06 MED ORDER — PRENATAL MULTIVITAMIN CH
1.0000 | ORAL_TABLET | Freq: Every day | ORAL | Status: DC
  Administered 2016-11-06 – 2016-11-07 (×2): 1 via ORAL
  Filled 2016-11-06 (×2): qty 1

## 2016-11-06 MED ORDER — SIMETHICONE 80 MG PO CHEW: 80.0000 mg | CHEWABLE_TABLET | ORAL | Status: DC | PRN

## 2016-11-06 MED ORDER — MISOPROSTOL 200 MCG PO TABS: 800.0000 ug | ORAL_TABLET | Freq: Once | ORAL | Status: DC

## 2016-11-06 MED ORDER — COCONUT OIL OIL
1.0000 "application " | TOPICAL_OIL | Status: DC | PRN
  Administered 2016-11-06: 1 via TOPICAL
  Filled 2016-11-06: qty 120

## 2016-11-06 MED ORDER — METFORMIN HCL 500 MG PO TABS
500.0000 mg | ORAL_TABLET | Freq: Two times a day (BID) | ORAL | Status: DC
  Administered 2016-11-06 – 2016-11-07 (×2): 500 mg via ORAL
  Filled 2016-11-06 (×3): qty 1

## 2016-11-06 MED ORDER — ONDANSETRON HCL 4 MG PO TABS: 4.0000 mg | ORAL_TABLET | ORAL | Status: DC | PRN

## 2016-11-06 MED ORDER — INSULIN ASPART 100 UNIT/ML ~~LOC~~ SOLN
0.0000 [IU] | Freq: Three times a day (TID) | SUBCUTANEOUS | Status: DC
  Administered 2016-11-06: 1 [IU] via SUBCUTANEOUS
  Filled 2016-11-06: qty 1

## 2016-11-06 MED ORDER — MISOPROSTOL 200 MCG PO TABS
ORAL_TABLET | ORAL | Status: AC
  Administered 2016-11-06: 06:00:00
  Filled 2016-11-06: qty 4

## 2016-11-06 MED ORDER — OXYTOCIN 10 UNIT/ML IJ SOLN
INTRAMUSCULAR | Status: AC
  Filled 2016-11-06: qty 2

## 2016-11-06 MED ORDER — ACETAMINOPHEN 325 MG PO TABS
650.0000 mg | ORAL_TABLET | ORAL | Status: DC | PRN
  Administered 2016-11-06: 650 mg via ORAL
  Filled 2016-11-06: qty 2

## 2016-11-06 MED ORDER — SENNOSIDES-DOCUSATE SODIUM 8.6-50 MG PO TABS
2.0000 | ORAL_TABLET | ORAL | Status: DC
  Administered 2016-11-07: 2 via ORAL
  Filled 2016-11-06: qty 2

## 2016-11-06 MED ORDER — ZOLPIDEM TARTRATE 5 MG PO TABS: 5.0000 mg | ORAL_TABLET | Freq: Every evening | ORAL | Status: DC | PRN

## 2016-11-06 MED ORDER — BENZOCAINE-MENTHOL 20-0.5 % EX AERO
INHALATION_SPRAY | CUTANEOUS | Status: AC
  Administered 2016-11-07: 1 via TOPICAL
  Filled 2016-11-06: qty 56

## 2016-11-06 MED ORDER — FERROUS SULFATE 325 (65 FE) MG PO TABS
325.0000 mg | ORAL_TABLET | Freq: Two times a day (BID) | ORAL | Status: DC
  Administered 2016-11-06 – 2016-11-07 (×3): 325 mg via ORAL
  Filled 2016-11-06 (×3): qty 1

## 2016-11-06 MED ORDER — AMMONIA AROMATIC IN INHA
RESPIRATORY_TRACT | Status: AC
  Filled 2016-11-06: qty 10

## 2016-11-06 MED ORDER — INSULIN ASPART 100 UNIT/ML ~~LOC~~ SOLN: 0.0000 [IU] | Freq: Every day | SUBCUTANEOUS | Status: DC

## 2016-11-06 MED ORDER — DIPHENHYDRAMINE HCL 25 MG PO CAPS: 25.0000 mg | ORAL_CAPSULE | Freq: Four times a day (QID) | ORAL | Status: DC | PRN

## 2016-11-06 MED ORDER — BENZOCAINE-MENTHOL 20-0.5 % EX AERO
1.0000 "application " | INHALATION_SPRAY | Freq: Three times a day (TID) | CUTANEOUS | Status: DC
  Administered 2016-11-06 – 2016-11-07 (×3): 1 via TOPICAL
  Filled 2016-11-06 (×2): qty 56

## 2016-11-06 MED ORDER — MISOPROSTOL 200 MCG PO TABS
ORAL_TABLET | ORAL | Status: AC
  Administered 2016-11-06: 06:00:00
  Filled 2016-11-06: qty 1

## 2016-11-06 MED ORDER — ONDANSETRON HCL 4 MG/2ML IJ SOLN: 4.0000 mg | INTRAMUSCULAR | Status: DC | PRN

## 2016-11-06 NOTE — L&D Delivery Note (Signed)
Delivery Note  First Stage: Labor onset: 11/05/16 @1200pm   Augmentation : AROM  Analgesia /Anesthesia intrapartum: Stadol IVP x 2 doses  AROM at 2321 - clear fluid  Second Stage: Complete dilation at 0218 Onset of pushing at 0218 FHR second stage: baseline: 125 bpm/ moderate variability/ +accels/ variable decels to nadir of 90 bpm with good return to baseline   Pt. Was pushing with great maternal effort in the lithotomy position. Delivery of the fetal head using the Ritgen's maneuver - head of the bed dropped in anticipation for shoulder dystocia. Restitution occurred, and shoulder dystocia called after inability to deliver anterior shoulder.  30 second shoulder dystocia relieved by McRoberts and suprapubic pressure.  She delivered viable female "Brandon" on 11/06/16 at 0256AM by CNM in ROA position.  No nuchal cord present.  Baby was stunned initially with bruising noted on face, and dried and stimulated on mom's abdomen.  He became vigorous and crying.  Cord double clamped, cut by FOB Cord blood sample collected   Third Stage: Placenta delivered via Tomasa BlaseSchultz intact with 3 VC @ [id number] Placenta disposition: hospital disposal  Uterine tone boggy with brisk bleeding - postpartum hemorrhage occurred with 550mL blood loss - Cytotec 800mcg given rectally with Pitocin 500mL IV bolus infusing.  Fundal massage performed with firming uterine tone.  No cervical laceration identified.   1st degree laceration identified with vaginal extension 2 figure of eight stitches placed after repair due to bleeding  Anesthesia for repair: 1% Lidocaine 30mL Repair 2.0 vicryl  Est. Blood Loss (mL): 550mL   Complications: Postpartum hemorrhage   Mom to postpartum.  Baby to Couplet care / Skin to Skin.  Newborn: Birth Weight: 3345 grams (7#6oz) Apgar Scores: 8, 9 Feeding planned: Breast  Carlean JewsMeredith Sigmon, CNM

## 2016-11-06 NOTE — Discharge Summary (Addendum)
Obstetrical Discharge Summary  Patient Name: Eileen Hunt DOB: 1991-03-28 MRN: [id number]  Date of Admission: 11/05/2016 Date of Discharge: 11/07/16 Primary OB: [facility]  Gestational Age at Delivery: [redacted]w[redacted]d  Antepartum complications: Insulin Dependent Gestational Diabetes, Obesity, Hx: Patient's maternal side: 123stCousin with autism, FOB's 2nd cousin has cerebral palsy, Heart palpitations s/p cardiology consult on 07/12/16, Echo on 07/27/16 - mild valvular regurgitation - no f/u indicated, unless worsening s/s, Heart murmur, Rubella Non-Immune   Admitting Diagnosis: Labor at 38 weeks, Insulin controlled GDM, Mild pre-eclampsia  Secondary Diagnosis: Patient Active Problem List   Diagnosis Date Noted  . Postpartum hemorrhage 11/06/2016  . Shoulder dystocia during labor and delivery 11/06/2016  . Labor and delivery, indication for care 11/05/2016  . Obesity in pregnancy 11/05/2016  . Insulin controlled gestational diabetes mellitus (GDM) in third trimester 11/05/2016  . Rubella non-immune status, antepartum 11/05/2016  . Pre-eclampsia, mild, third trimester 11/05/2016  . Insulin controlled White classification A2 gestational diabetes mellitus (GDM) 09/04/2016  . Class 2 obesity due to excess calories without serious comorbidity with body mass index (BMI) of 38.0 to 38.9 in adult 09/04/2016    Augmentation: AROM Intrapartum complications/course:  Pt. Was pushing with great maternal effort in the lithotomy position. Delivery of the fetal head using the Ritgen's maneuver - head of the bed dropped in anticipation for shoulder dystocia. Restitution occurred, and shoulder dystocia called after inability to deliver anterior shoulder.  30 second shoulder dystocia relieved by McRoberts and suprapubic pressure.  She delivered viable female "Eileen Hunt" on 11/06/16 at 0256AM by CNM in ROA position.  No nuchal cord present.  Baby was stunned initially with bruising noted on face, and dried and  stimulated on mom's abdomen.  He became vigorous and crying.  Date of Delivery:  Delivered By: MLars Pinks CNM  Delivery Type: spontaneous vaginal delivery Anesthesia: epidural Placenta: sponatneous Laceration: 1st degree with vaginal extension Episiotomy: none Newborn Data: Live born female  Birth Weight: 7 lb 6 oz (3345 g) APGAR: 8, 9  Postpartum Procedures:  Placenta disposition: hospital disposal  Uterine tone boggy with brisk bleeding - postpartum hemorrhage occurred with 5529mblood loss - Cytotec 80054mgiven rectally with Pitocin 500m21m bolus infusing.  Fundal massage performed with firming uterine tone.  No cervical laceration identified.   1st degree laceration identified with vaginal extension 2 figure of eight stitches placed after repair due to bleeding  Anesthesia for repair: 1% Lidocaine 30mL73mair 2.0 vicryl  Est. Blood Loss (mL): 550mL 92most partum course:  Postpartum course complicated by significantly elevated blood sugars. Consult to diabetic management and started metformin 500mg B14mith SSI meal coverage and hs coverage for fasting sugars >200. She will see her PCP during her postpartum period to titrate antihyperglycemic meds.    By time of discharge on PPD#1, her pain was controlled on oral pain medications; she had appropriate lochia and was ambulating, voiding without difficulty and tolerating regular diet.  She was deemed stable for discharge to home.   Stopped all insulin and metformin before d/c  MMR given before d/c  Discharge Physical Exam: BP 121/74 (BP Location: Right Arm)   Pulse (!) 102   Temp 98.5 F (36.9 C) (Oral)   Resp 18   Ht _0  (1.575 m)   Wt 103 kg (227 lb)   LMP 02/13/2016   SpO2 98%   Breastfeeding? Unknown   BMI 41.52 kg/m   General: NAD CV: RRR Pulm: CTABL, nl effort  ABD: s/nd/nt, fundus firm and below the umbilicus Lochia: moderate Incision: c/d/i DVT Evaluation: LE non-ttp, no evidence of DVT on  exam.  Hemoglobin  Date Value Ref Range Status  11/06/2016 10.8 (L) 12.0 - 16.0 g/dL Final   HCT  Date Value Ref Range Status  11/06/2016 31.3 (L) 35.0 - 47.0 % Final     Disposition: stable, discharge to home. Baby Feeding: breastmilk Baby Disposition: home with mom  Rh Immune globulin given: N/A Rubella vaccine given: PP Tdap vaccine given in AP or PP setting: given 09/19/16 Flu vaccine given in AP or PP setting: DECLINED  Contraception: OCPs  Prenatal Labs:  ABO, Rh:  O Positive  Antibody:  Negative Rubella: Nonimmune (07/25 0000)  RPR: Nonreactive (07/25 0000)  HBsAg: Negative (07/25 0000)  HIV: Non-reactive (07/25 0000)  GTT: Early 1 hour GTT 228 Hgb A1C 7 initially on 10/16, Hgb A1C 6.3 on 10/17/16 GBS:   Negative    Plan:  Eileen Hunt was discharged to home in good condition. Follow-up appointment at [facility] in 1 week for BP check, then in 6 weeks for PP visit and needs 2 hour GTT  Needs followup appointment with PCP or endocrinology for sugar management.  Discharge Medications: Motrin , micronor   Follow-up Information    Darliss Cheney, CNM Follow up in 1 week(s).   Specialty:  Certified Nurse Midwife Why:  BP check, then in 6 weeks for PP visit and needs 2 hour GTT Contact information: [address] [place] [address] [phone]           Signed: Gwen Her Schermerhorn MD

## 2016-11-06 NOTE — Progress Notes (Signed)
Inpatient Diabetes Program Recommendations  AACE/ADA: New Consensus Statement on Inpatient Glycemic Control (2015)  Target Ranges:  Prepandial:   less than 140 mg/dL      Peak postprandial:   less than 180 mg/dL (1-2 hours)      Critically ill patients:  140 - 180 mg/dL   Review of Glycemic Control  Diabetes history: GDM Current orders for Inpatient glycemic control: None  Inpatient Diabetes Program Recommendations:   Glucose 213-230 mg/dl post delivery. Glucose trends should also decrease with breast feeding. Please consider placing patient on Metformin 500 mg BID and Novolog Sensitive (0-9 units) TID + HS coverage while inpatient.   Note patient looks like she would most likely need to be discharged on Metformin and follow up with her PCP.   Thanks,  Christena DeemShannon Loreal Schuessler RN, MSN, [facility] Inpatient Diabetes Coordinator Team Pager [phone] (8a-5p)

## 2016-11-06 NOTE — Progress Notes (Signed)
S:  Progressing well after AROM       Stadol PRN for relief   O:  VS: Blood pressure (!) 156/83, pulse 77, temperature 98.3 F (36.8 C), temperature source Oral, resp. rate 18, height 5\' 2"  (1.575 m), weight 103 kg (227 lb), last menstrual period 02/13/2016.        FHR : baseline 120 bpm / variability moderate / accelerations + / early and variable decelerations        Toco: contractions every 2-3 minutes / moderate - strong         Cervix : Dilation: 10 Dilation Complete Date: 11/06/16 Dilation Complete Time: 0218 Effacement (%): 100 Cervical Position: Middle Station: +2 Presentation: Vertex Exam by:: S. Apel, RN        Membranes: AROM - clear fluid  A: Active labor     FHR category 2  P: Anticipate NSVD soon  Carlean JewsMeredith Lorance Pickeral, CNM

## 2016-11-07 LAB — GLUCOSE, CAPILLARY: GLUCOSE-CAPILLARY: 69 mg/dL (ref 65–99)

## 2016-11-07 MED ORDER — MEASLES, MUMPS & RUBELLA VAC ~~LOC~~ INJ: 0.5000 mL | INJECTION | Freq: Once | SUBCUTANEOUS | 0 refills | Status: AC

## 2016-11-07 MED ORDER — MEASLES, MUMPS & RUBELLA VAC ~~LOC~~ INJ
0.5000 mL | INJECTION | Freq: Once | SUBCUTANEOUS | Status: AC
  Administered 2016-11-07: 0.5 mL via SUBCUTANEOUS
  Filled 2016-11-07 (×2): qty 0.5

## 2016-11-07 MED ORDER — NORETHINDRONE 0.35 MG PO TABS: 1.0000 | ORAL_TABLET | Freq: Every day | ORAL | 11 refills | Status: AC

## 2016-11-07 MED ORDER — BENZOCAINE-MENTHOL 20-0.5 % EX AERO: 1.0000 "application " | INHALATION_SPRAY | Freq: Three times a day (TID) | CUTANEOUS | 1 refills | Status: AC

## 2016-11-07 MED ORDER — IBUPROFEN 600 MG PO TABS: 600.0000 mg | ORAL_TABLET | Freq: Four times a day (QID) | ORAL | 0 refills | Status: AC

## 2016-11-07 NOTE — Discharge Instructions (Signed)
Please call your doctor or return to the ER if you experience any chest pains, dizziness, shortness of breath, fever greater than 101, any heavy bleeding (saturating more than 1 pad per hour), large clots, or foul smelling discharge, any worsening abdominal pain and cramping that is not controlled by pain medication, or any signs of postpartum depression. No tampons, enemas, douches, or sexual intercourse for 6 weeks. Also avoid tub baths, hot tubs, or swimming for 6 weeks.

## 2016-11-07 NOTE — Progress Notes (Signed)
Discharge order received from doctor. MMR vaccine given prior to discharge. Reviewed discharge instructions and prescriptions with patient and answered all questions. Follow up appointment instructions given. Patient verbalized understanding. ID bands checked. Patient discharged home with infant via wheelchair by nursing/auxillary.     Avereigh Spainhower Garner, RN  

## 2016-11-07 NOTE — Lactation Note (Signed)
This note was copied from a baby's chart. Lactation Consultation Note  Patient Name: Eileen Hunt LTEIH'D Date: 11/07/2016 Reason for consult: Follow-up assessment Baby has been very sleepy where Mom voiced problems getting him to nurse much during the night/morning.  We tried to have him nurse over an hour ago, but too sleepy despite colostrum expressed for him to lick. He was placed skin to skin and now stimulated to wake and feed. He latches well, but will fall asleep with nipple in his mouth unless mom compresses breast and someone stimulates him to stay awake and suck. With these efforts, Mom can hear him swallow more and become much more vigorous. Nipple is intact when he releases his latch. Family is very supportive. I gave Mom a pump kit to use for future needs and reviewed WIC benefits; [employer]; and other BF basics, especially how to tell that baby is well nourished and when to call for [facility] help.   Maternal Data    Feeding    LATCH Score/Interventions Latch: Grasps breast easily, tongue down, lips flanged, rhythmical sucking.  Audible Swallowing: A few with stimulation Intervention(s): Hand expression  Type of Nipple: Everted at rest and after stimulation  Comfort (Breast/Nipple): Soft / non-tender     Hold (Positioning): No assistance needed to correctly position infant at breast.  LATCH Score: 9  Lactation Tools Discussed/Used     Consult Status Consult Status: Complete    Roque Cash 11/07/2016, 12:49 PM

## 2016-11-08 LAB — RPR: RPR Ser Ql: NONREACTIVE

## 2017-04-08 IMAGING — US US OB LIMITED
1 series · 14 of 25 positions shown · non-contrast
Comparison: none

CLINICAL DATA: 24-year-old female reportedly 13 weeks 3 days
pregnant presents with a 2 day history of lower abdominal pain.

EXAM:
LIMITED OBSTETRIC ULTRASOUND

[Series 1: us ob limited · 0.26mm/px · 14 of 25 slices shown]
[im 1/25]
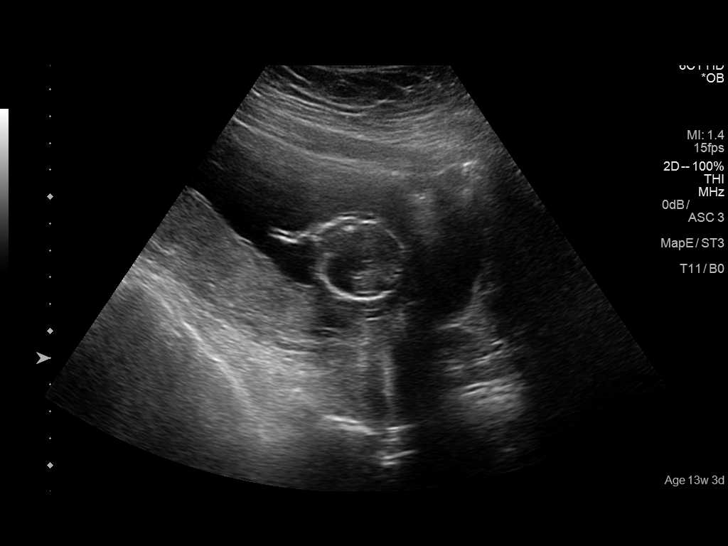
[im 3/25]
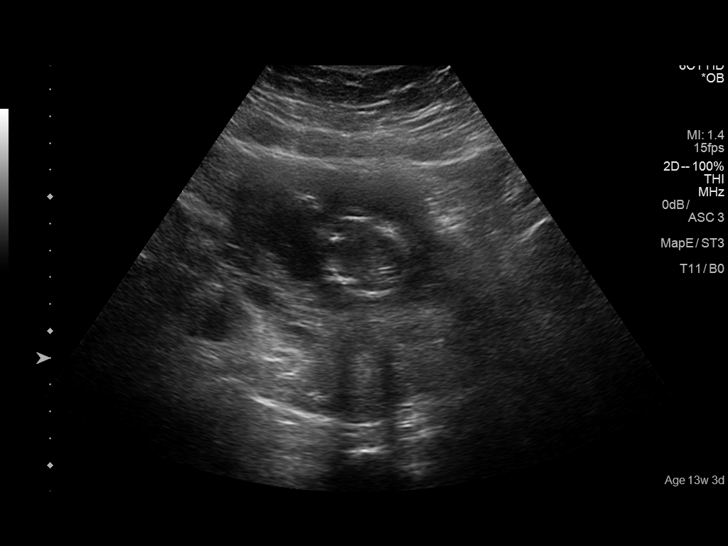
[im 5/25]
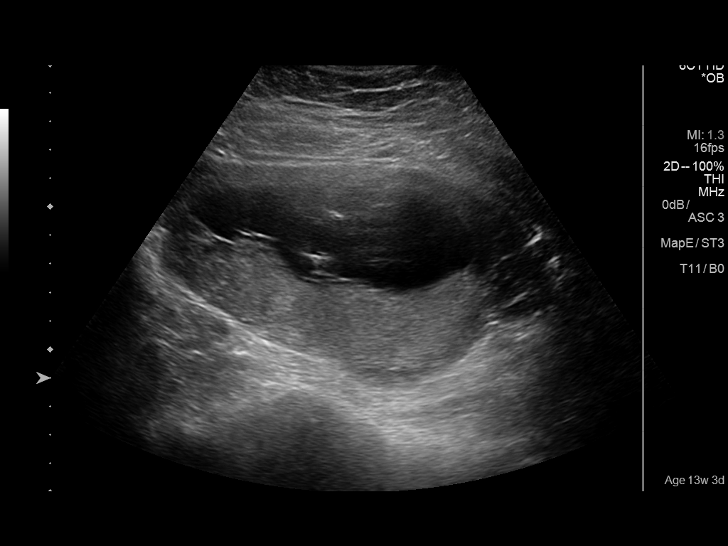
[im 7/25]
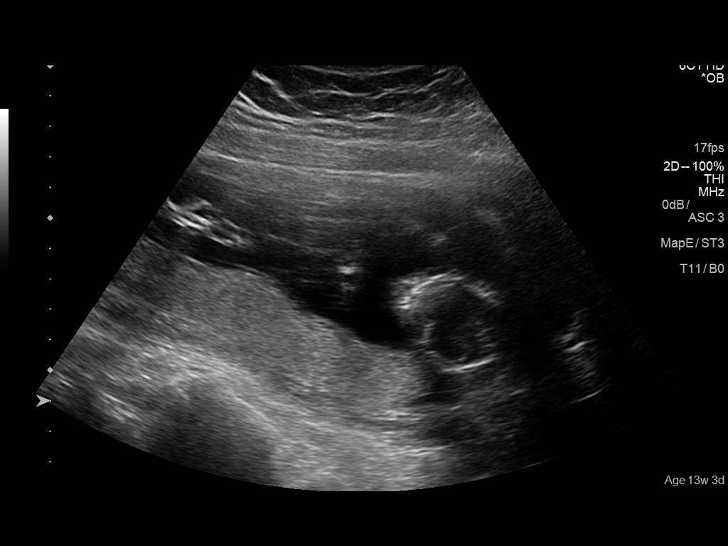
[im 9/25]
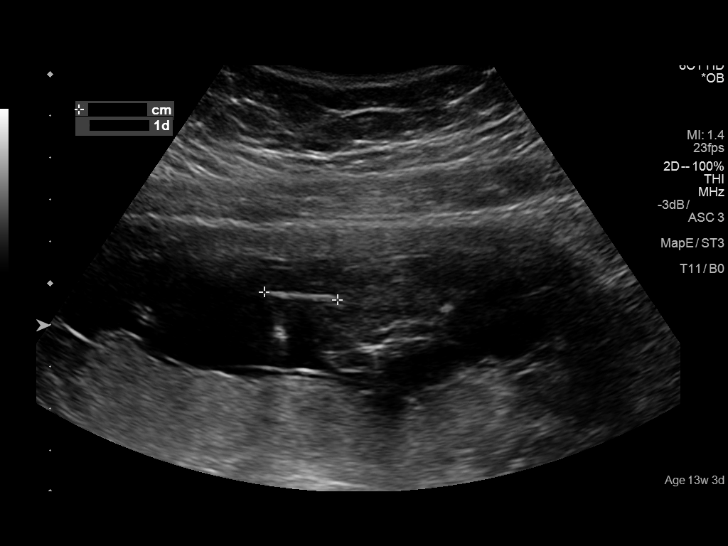
[im 10/25]
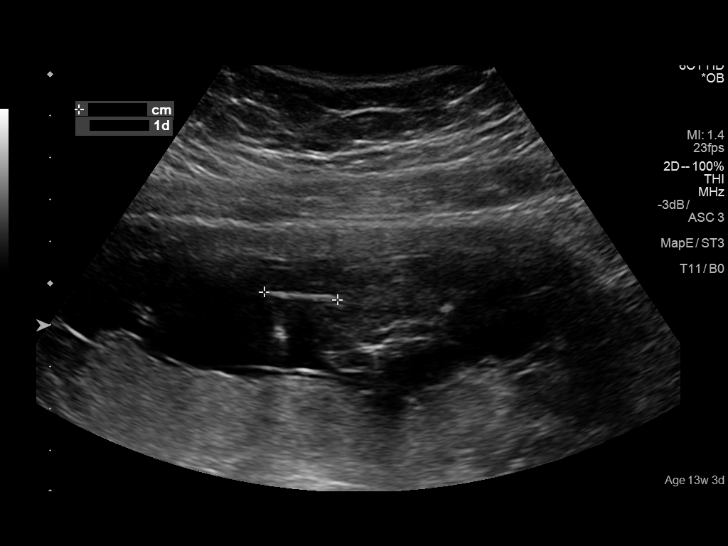
[im 12/25]
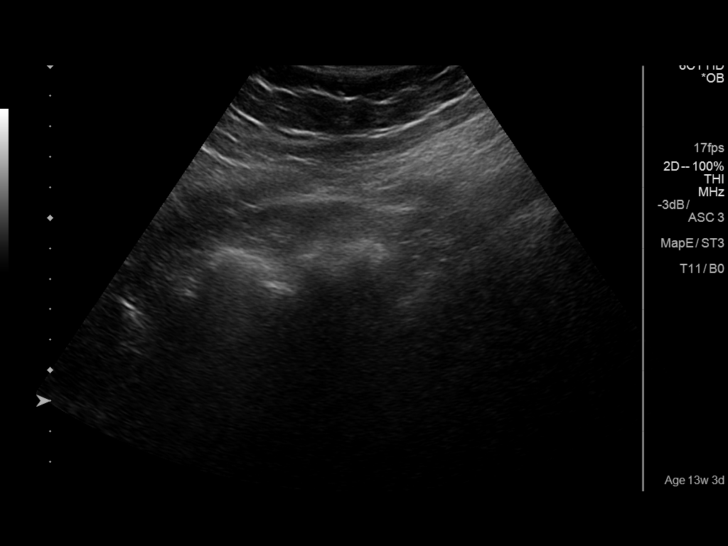
[im 14/25]
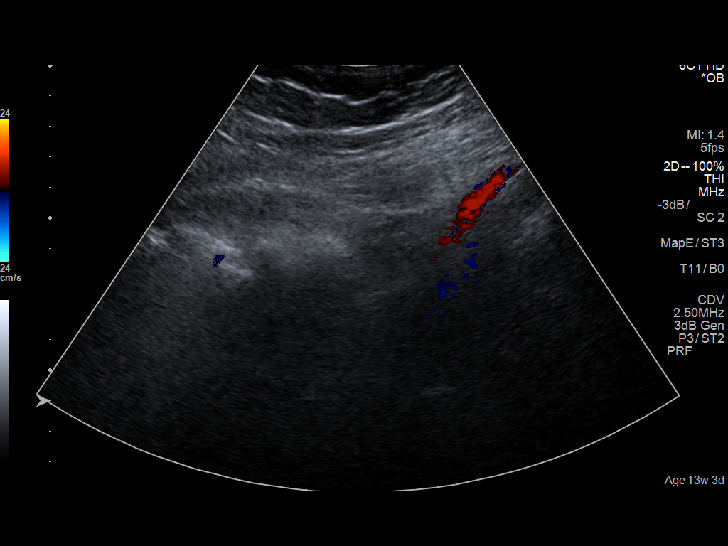
[im 16/25]
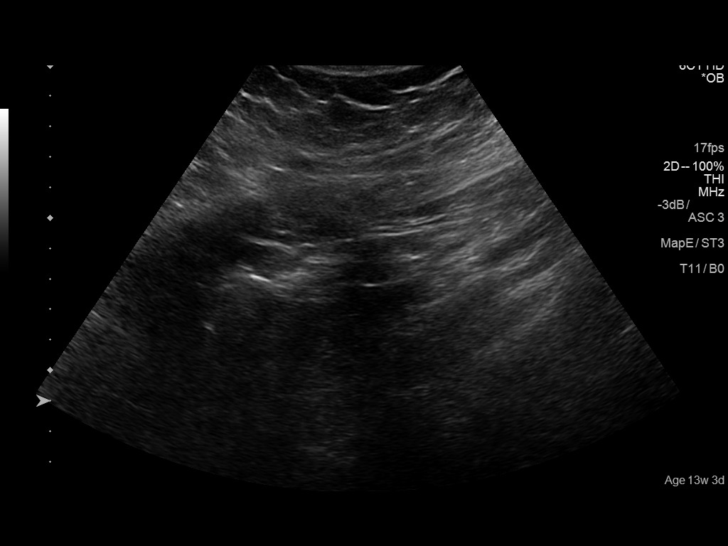
[im 17/25]
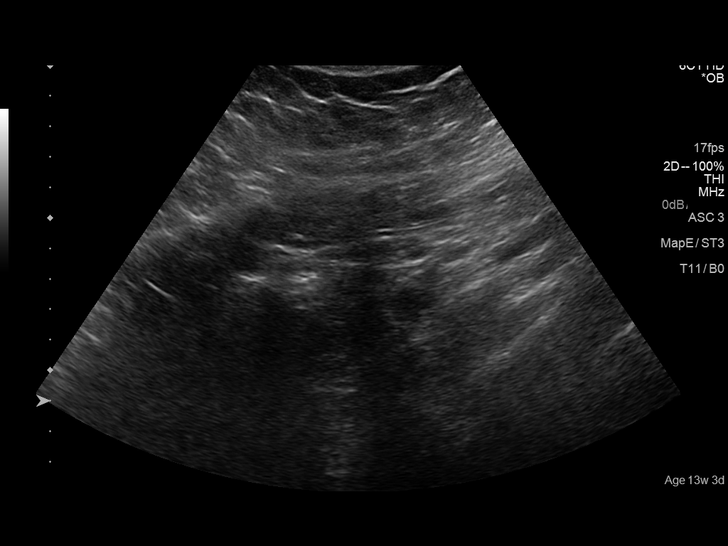
[im 19/25]
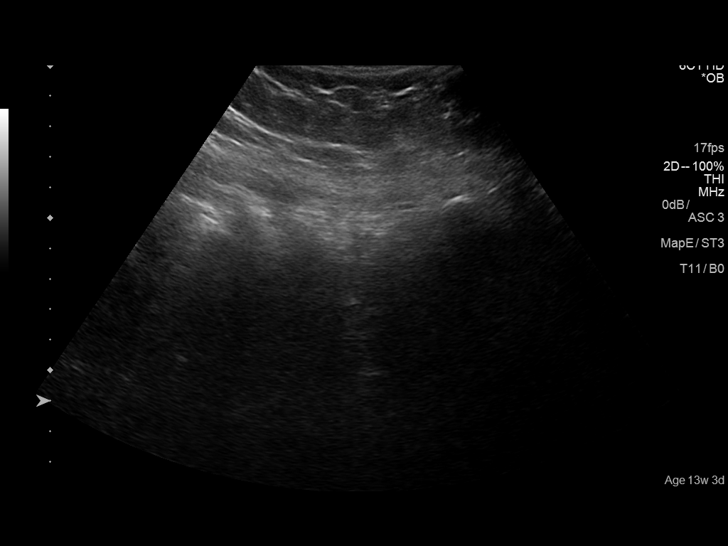
[im 21/25]
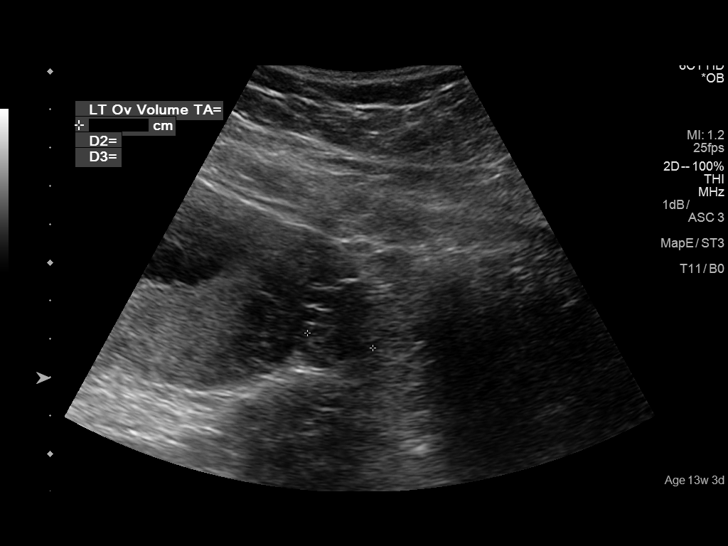
[im 23/25]
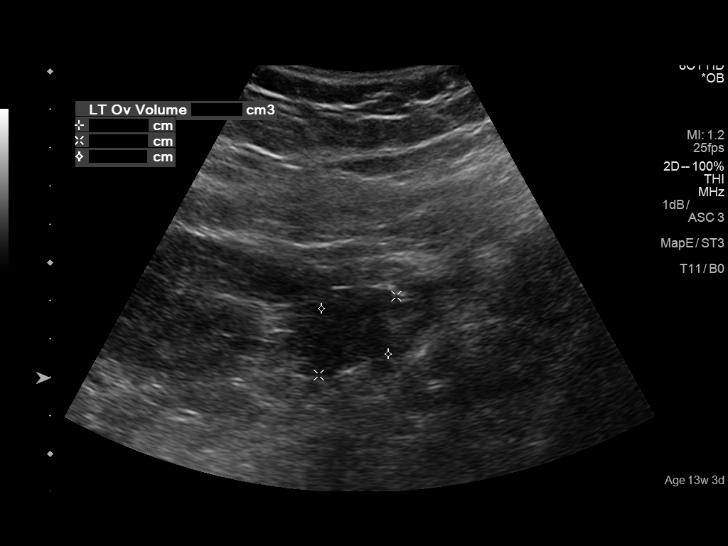
[im 25/25]
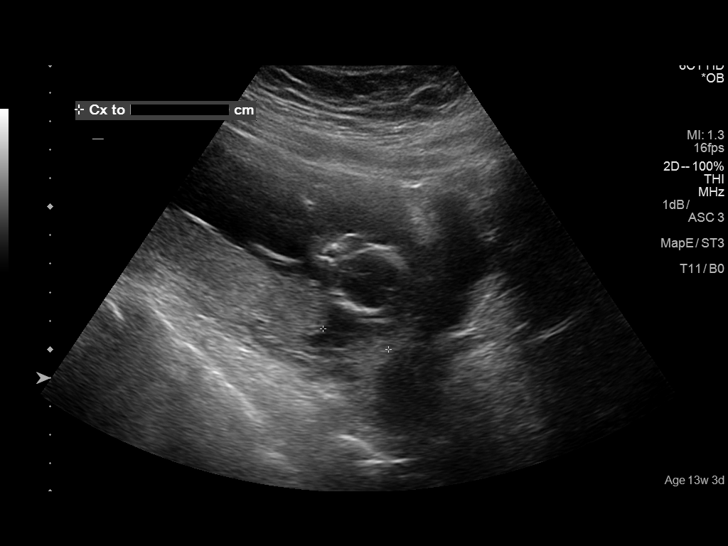

[14 of 25 positions shown; findings below may reference images not displayed]

FINDINGS: Number of Fetuses:  1

Heart Rate:  157 bpm

Movement:  Yes

Presentation: Cephalic

Placental Location: Posterior

Previa: No

Amniotic Fluid (Subjective):  Within normal limits.

FL:  1.71cm 15w  0d

MATERNAL FINDINGS:

Cervix:  Appears closed.  The cervical length is 3.5 cm.

Uterus/Adnexae:  No abnormality visualized.
IMPRESSION: Viable single intrauterine pregnancy as detailed above. By femoral
length, the estimated gestational age is 15 weeks 0 days.

This exam is performed on an emergent basis and does not
comprehensively evaluate fetal size, dating, or anatomy; follow-up
complete OB US should be considered if further fetal assessment is
warranted.
